# Patient Record
Sex: Male | Born: 1970 | Race: Black or African American | Hispanic: No | Marital: Single | Smoking: Never smoker
Health system: Southern US, Community
[De-identification: ages and names within clinical notes are randomized; demographics above are authoritative.]

---

## 2010-06-06 ENCOUNTER — Emergency Department (HOSPITAL_COMMUNITY)
Admission: EM | Admit: 2010-06-06 | Discharge: 2010-06-06 | Payer: Self-pay | Source: Home / Self Care | Admitting: Emergency Medicine

## 2011-01-16 LAB — POCT CARDIAC MARKERS
CKMB, poc: 1.3 ng/mL (ref 1.0–8.0)
Troponin i, poc: <0.05 ng/mL (ref 0.00–0.09)

## 2011-01-16 LAB — CBC
Hemoglobin: 16.3 g/dL (ref 13.0–17.0)
MCH: 29.2 pg (ref 26.0–34.0)
MCHC: 34.8 g/dL (ref 30.0–36.0)
MCV: 83.9 fL (ref 78.0–100.0)
RBC: 5.58 MIL/uL (ref 4.22–5.81)
RDW: 12.8 % (ref 11.5–15.5)
WBC: 6.1 10*3/uL (ref 4.0–10.5)

## 2011-01-16 LAB — DIFFERENTIAL
Basophils Absolute: 0 10*3/uL (ref 0.0–0.1)
Basophils Relative: 0 % (ref 0–1)
Eosinophils Absolute: 0 10*3/uL (ref 0.0–0.7)
Lymphocytes Relative: 60 % — ABNORMAL HIGH (ref 12–46)
Monocytes Relative: 8 % (ref 3–12)
Neutro Abs: 1.9 10*3/uL (ref 1.7–7.7)

## 2011-01-16 LAB — BASIC METABOLIC PANEL
Calcium: 9.7 mg/dL (ref 8.4–10.5)
GFR calc Af Amer: >60 mL/min (ref >60)
GFR calc non Af Amer: >60 mL/min (ref >60)
Potassium: 3.4 mEq/L — ABNORMAL LOW (ref 3.5–5.1)
Sodium: 137 mEq/L (ref 135–145)

## 2011-01-16 LAB — D-DIMER, QUANTITATIVE: D-Dimer, Quant: 0.23 ug/mL-FEU (ref 0.00–0.48)

## 2012-03-11 IMAGING — CT CT ANGIO CHEST
2 of 6 series · 19 of 36 positions shown · IV contrast (agent unspecified)
Comparison: [HOSPITAL] chest x-ray 06/06/2010.

CLINICAL DATA: Chest pain, pleuritic shortness of breath.
Evaluate for pulmonary embolus.

CT ANGIOGRAPHY CHEST WITH CONTRAST
TECHNIQUE: Multidetector CT imaging of the chest was performed
using the standard protocol during bolus administration of
intravenous contrast.  Multiplanar CT image reconstructions
including MIPs were obtained to evaluate the vascular anatomy.
Contrast:  Intravenous 100 ml 1mnipaque-HPP.

[Series 9: pulm embolism 1.0 b25f thins · axial · 0.70mm/px · z∈[-261,-25]mm · 18 of 264 slices shown]
[im 14/264  lung]
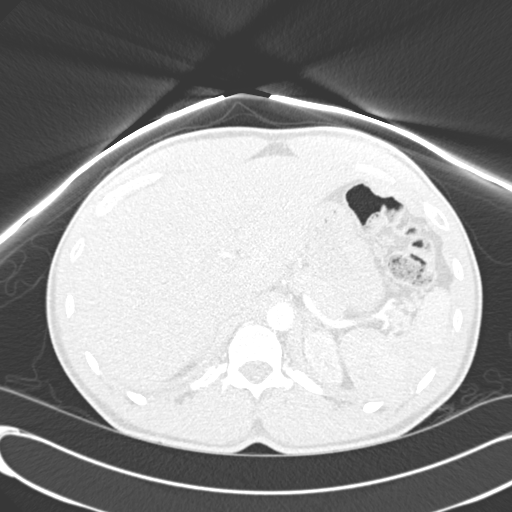
[im 27/264  mediastinal]
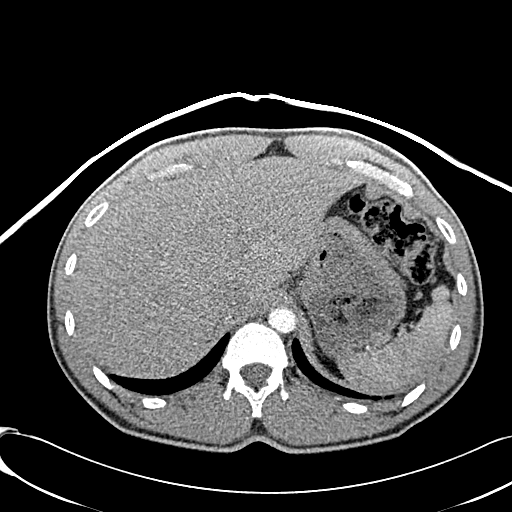
[im 40/264  lung]
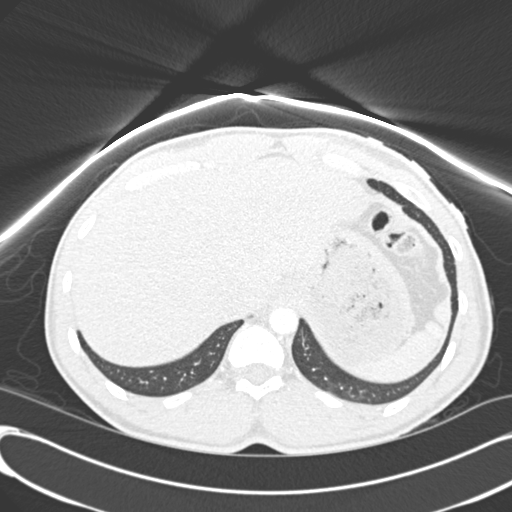
[im 53/264  mediastinal]
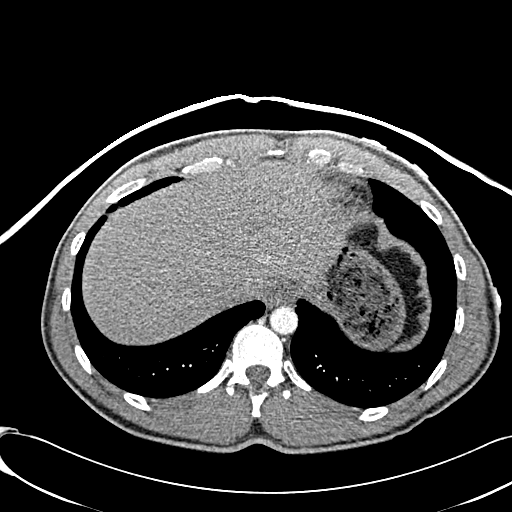
[im 66/264  lung]
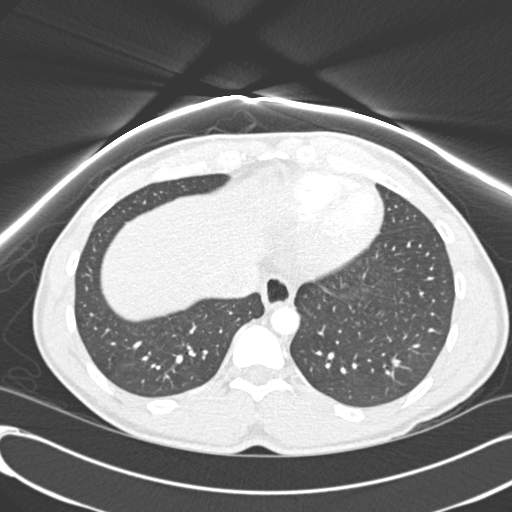
[im 79/264  mediastinal]
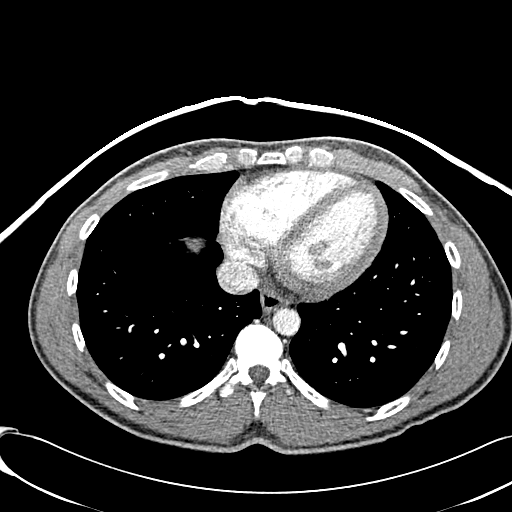
[im 93/264  lung]
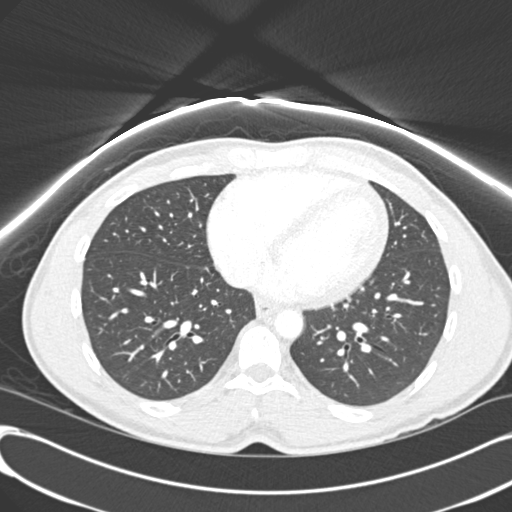
[im 106/264  mediastinal]
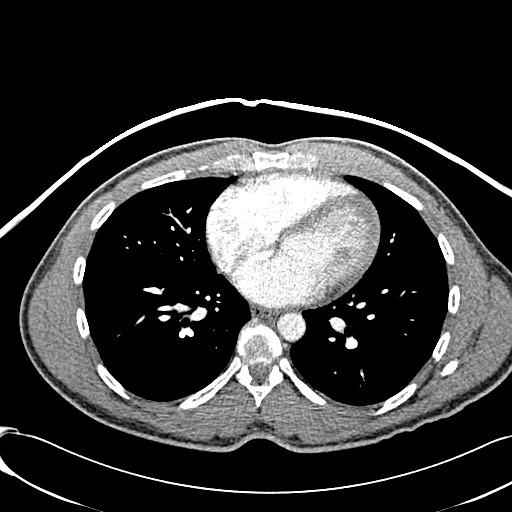
[im 119/264  lung]
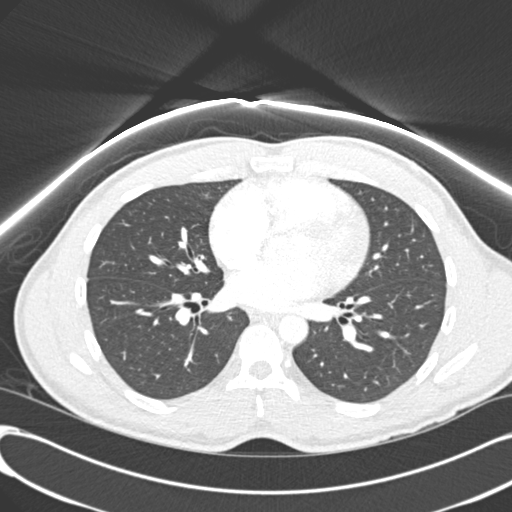
[im 145/264  mediastinal]
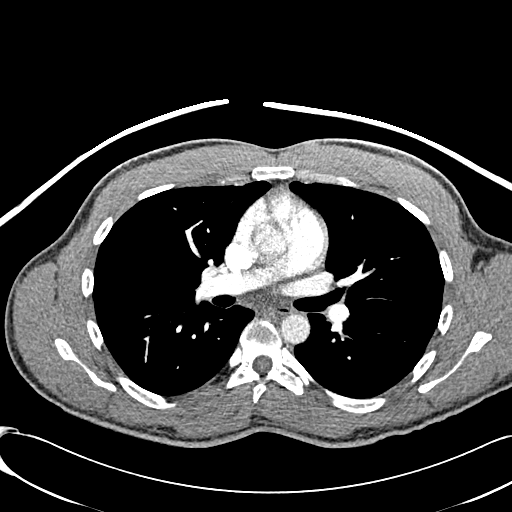
[im 158/264  lung]
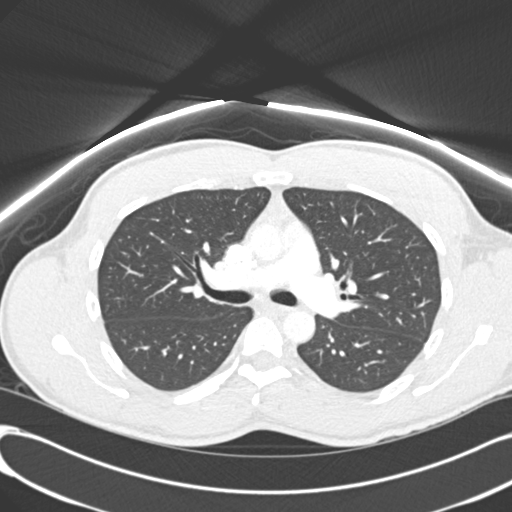
[im 171/264  mediastinal]
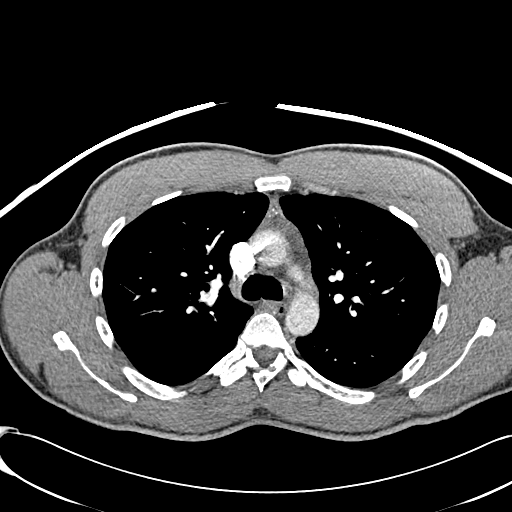
[im 185/264  lung]
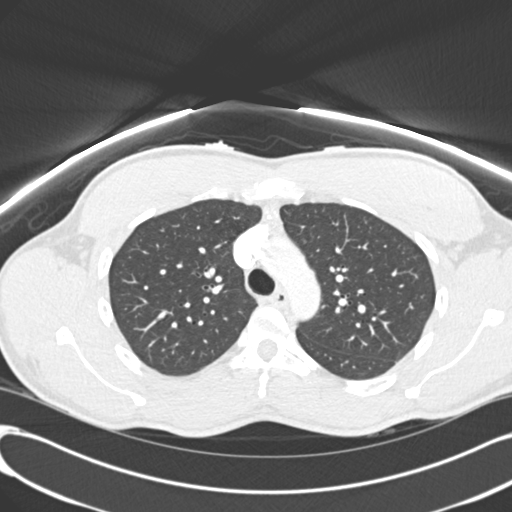
[im 198/264  mediastinal]
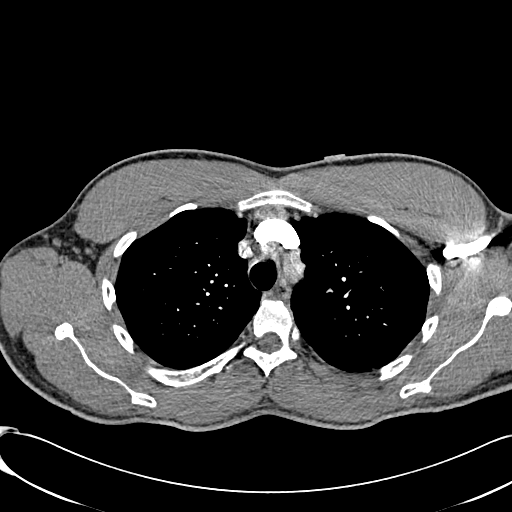
[im 211/264  lung]
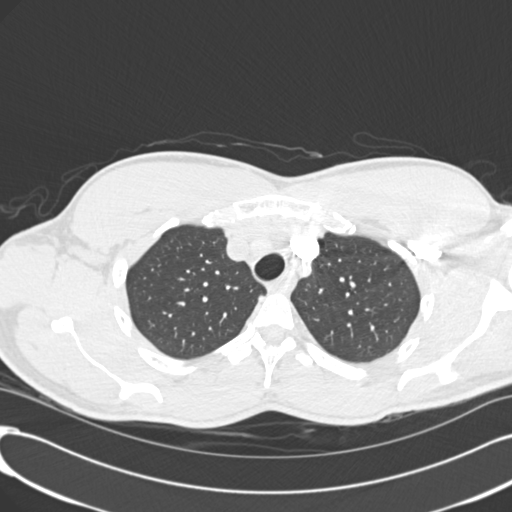
[im 224/264  mediastinal]
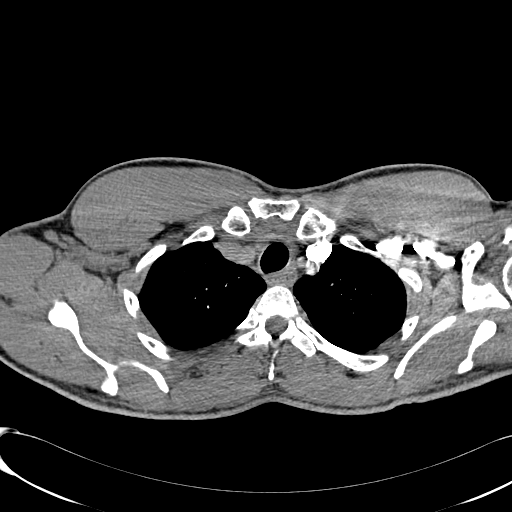
[im 237/264  lung]
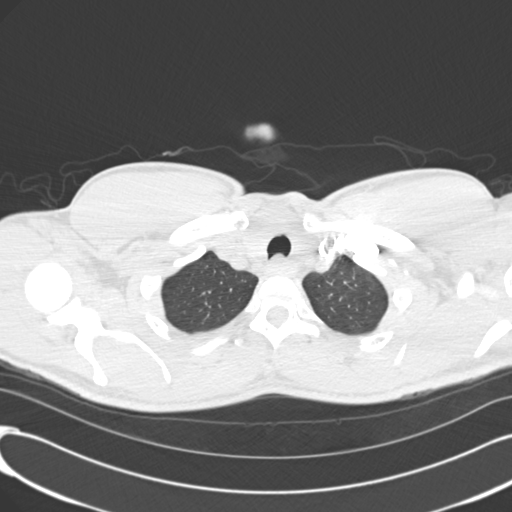
[im 250/264  mediastinal]
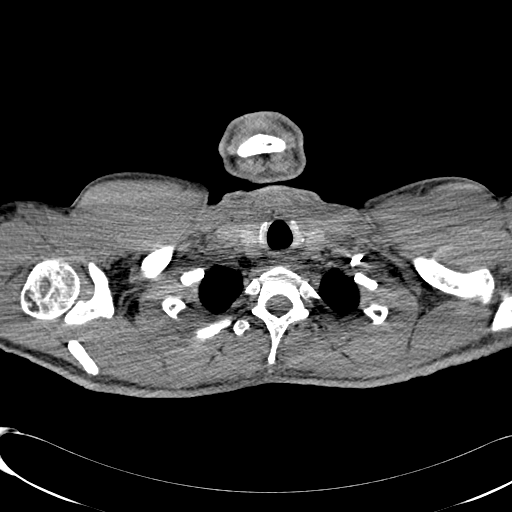

[Series 602: cor mpr · coronal · 0.70mm/px · 1 of 83 slices shown]
[im 42/83  mediastinal]
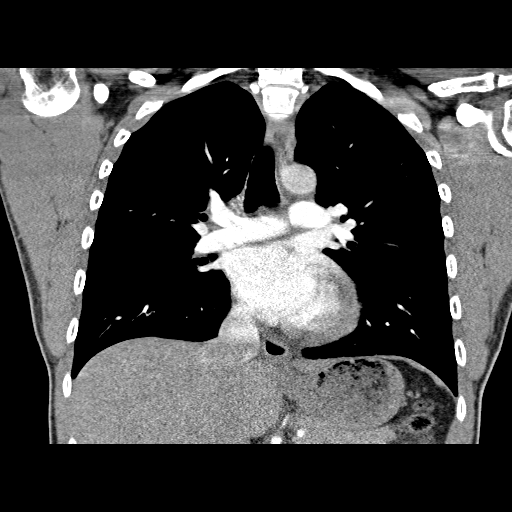

[19 of 36 positions shown; findings below may reference images not displayed]

FINDINGS: No evidence for acute pulmonary embolus is seen with
clear lungs.  Heart size normal.  No pneumothorax or pleural fluid
seen.  Upper abdominal organs appear normal.  At the anterior
superior mediastinum is serpiginous vessel fed by brachycephalic
vein consistent with likely benign small varix or arteriovenous
malformation.  No evidence for associated liter mass at thymus
seen.  No other mediastinal, hilar, axillary or supraclavicular
mass/adenopathy seen.  Osseous structures appear normal.

Review of the MIP images confirms the above findings.
IMPRESSION: 1.  No evidence for acute pulmonary embolus.
2.  Anterior superior mediastinal small varix or arteriovenous
malformation without complicating features.  Probable incidental
finding.
3.  Otherwise, negative.

## 2014-11-24 ENCOUNTER — Ambulatory Visit: Payer: Self-pay | Admitting: Podiatry

## 2014-12-05 ENCOUNTER — Encounter: Payer: Self-pay | Admitting: Podiatry

## 2014-12-05 ENCOUNTER — Ambulatory Visit (INDEPENDENT_AMBULATORY_CARE_PROVIDER_SITE_OTHER): Payer: BC Managed Care – PPO | Admitting: Podiatry

## 2014-12-05 DIAGNOSIS — L608 Other nail disorders: Secondary | ICD-10-CM

## 2014-12-05 DIAGNOSIS — B351 Tinea unguium: Secondary | ICD-10-CM

## 2014-12-05 NOTE — Progress Notes (Signed)
   Subjective:    Patient ID: Christopher Coleman, male    DOB: [birth year], 44 y.o.   MRN: [id number]  HPI  44 year old male presents the office today with complaints of thick, discolored toenails. He states he is previous even seen at the [place] where he is tried topical clotrimazole and various OTC treatments for nail fungus. He has not seen much improvement in symptoms with these medications. He denies any redness or drainage from the nail sites. He is inquiring about laser treatment. No other complaints at this time.   Review of Systems  Allergic/Immunologic: Positive for food allergies.  All other systems reviewed and are negative.      Objective:   Physical Exam AAO 3, NAD DP/PT pulses palpable, CRT less than 3 seconds Protective sensation intact with Simms Weinstein monofilament, vibratory sensation intact, Achilles tendon reflex intact. Nails are hypertrophic, dystrophic, discolored. There is no surrounding erythema or drainage from the nail sites. No open lesions or pre-ulcerative lesions. No areas of tenderness to bilateral lower extremities and no overlying edema, erythema, increase in warmth. MMT 5/5, ROM WNL No pain with calf compression, swelling, warmth, erythema.     Assessment & Plan:  44 year old male presents for likely onychomycosis inquiring about laser treatment. -Discussed various treatment options for onychomycosis with the patient. At this time the nails were biopsied and sent to [facility] last for evaluation before proceeding with treatment. I discussed with the patient laser treatment however we will await the results of the biopsy to see if laser is a viable option. -Follow-up after nail biopsy results or sooner if any problems are to arise. In the meantime, encouraged call the office with any questions, concerns, change in symptoms.

## 2014-12-05 NOTE — Patient Instructions (Signed)

## 2014-12-25 ENCOUNTER — Telehealth: Payer: Self-pay | Admitting: *Deleted

## 2014-12-25 NOTE — Telephone Encounter (Signed)
Pt request fungal culture results.  Informed pt the results take 4 - 6 week, and once received we would call him with instructions.

## 2014-12-27 ENCOUNTER — Encounter: Payer: Self-pay | Admitting: Podiatry

## 2015-01-04 ENCOUNTER — Telehealth: Payer: Self-pay | Admitting: *Deleted

## 2015-01-04 ENCOUNTER — Encounter: Payer: Self-pay | Admitting: Podiatry

## 2015-01-04 ENCOUNTER — Ambulatory Visit (INDEPENDENT_AMBULATORY_CARE_PROVIDER_SITE_OTHER): Payer: BC Managed Care – PPO | Admitting: Podiatry

## 2015-01-04 VITALS — BP 131/84 | HR 70 | Ht 63.0 in | Wt 155.0 lb

## 2015-01-04 DIAGNOSIS — B351 Tinea unguium: Secondary | ICD-10-CM | POA: Diagnosis not present

## 2015-01-04 DIAGNOSIS — C4372 Malignant melanoma of left lower limb, including hip: Secondary | ICD-10-CM

## 2015-01-04 NOTE — Telephone Encounter (Signed)
I called and informed patient we have him scheduled to see Dr. Allyson Sabal on 01/16/2015 at 10:20am.  It's off [address].  "I'm in a store right now, I can't write it down."  Okay, just look up [facility].  "I will thank you."

## 2015-01-04 NOTE — Patient Instructions (Signed)
Follow up with dermatology.

## 2015-01-07 ENCOUNTER — Encounter: Payer: Self-pay | Admitting: Podiatry

## 2015-01-07 NOTE — Progress Notes (Signed)
Patient ID: Christopher Coleman, male   DOB: January 16, 1971, 44 y.o.   MRN: [id number]  Subjective: 44 year old male presents the opposite discussed the nail culture results. He denies any acute changes since last appointment and no other complaints at this time. Denies any systemic complaints as fevers, chills, nausea, vomiting.  Objective: AAO 3, NAD DP/PT pulses palpable, CRT less than 3 seconds Protective sensation intact with Simms Weinstein monofilament, Achilles tendon reflex intact. Nails are hypertrophic, dystrophic, discolored 10. There is no swelling erythema or drainage Nail sites. There is no tenderness to palpation of the nails. There is darkened discoloration with linear hyperpigmented lines with in both the left and fifth digit and the right fifth digit. There is no extension of the hyperpigmented lesion within the cuticle or skin. The pigment color changes appear to be uniform in color within the nail. No open lesions or pre-ulcerative lesions identified bilaterally. No overlying edema, erythema, increase in warmth to bilateral lower extremity is. No pain with calf compression, swelling, warmth, erythema.  Assessment: 44 year old male with onychomycosis, possible melanotic lesion  Plan: -Nail biopsy results were reviewed with the patient. It does reveal onychomycosis however it also did reveal possible melanotic lesion within the toenail. Due to these findings discuss further biopsy the nails or dermatology evaluation. This time we'll proceed with dermatology evaluation. The patient is also a patient the [facility] and he was given copies of his nail biopsy results. He has an appointment with podiatry at the [facility] on 01/07/2015. In regards to the nail fungus he was inquiring about laser therapy. Discussed and this is an option however for proceed with laser will pursue dermatology evaluation. -Follow-up of the dermatology or sooner should any problems arise. In the meantime  encouraged to call the office any questions, concerns, change in symptoms.

## 2020-01-06 ENCOUNTER — Ambulatory Visit: Payer: Self-pay | Attending: Internal Medicine

## 2020-01-06 DIAGNOSIS — Z23 Encounter for immunization: Secondary | ICD-10-CM | POA: Insufficient documentation

## 2020-01-06 NOTE — Progress Notes (Signed)
   Covid-19 Vaccination Clinic  Name:  Christopher Coleman    MRN: [id number] DOB: 06-01-1971  01/06/2020  [name] was observed post Covid-19 immunization for 15 minutes without incident. He was provided with Vaccine Information Sheet and instruction to access the V-Safe system.   [name] was instructed to call 911 with any severe reactions post vaccine: Marland Kitchen Difficulty breathing  . Swelling of face and throat  . A fast heartbeat  . A bad rash all over body  . Dizziness and weakness   Immunizations Administered    Name Date Dose VIS Date Route   Pfizer COVID-19 Vaccine 01/06/2020  9:18 AM 0.3 mL 10/13/2019 Intramuscular   Manufacturer: [place]   Lot: [id number]   [place]: [id number]

## 2020-01-27 ENCOUNTER — Ambulatory Visit: Payer: Self-pay | Attending: Internal Medicine

## 2020-01-27 DIAGNOSIS — Z23 Encounter for immunization: Secondary | ICD-10-CM

## 2020-01-27 NOTE — Progress Notes (Signed)
   Covid-19 Vaccination Clinic  Name:  Tari Jointer    MRN: [id number] DOB: 1970/12/28  01/27/2020  Mr. Sprunk was observed post Covid-19 immunization for 15 minutes without incident. He was provided with Vaccine Information Sheet and instruction to access the V-Safe system.   Mr. Adinolfi was instructed to call 911 with any severe reactions post vaccine: Marland Kitchen Difficulty breathing  . Swelling of face and throat  . A fast heartbeat  . A bad rash all over body  . Dizziness and weakness   Immunizations Administered    Name Date Dose VIS Date Route   Pfizer COVID-19 Vaccine 01/27/2020  9:08 AM 0.3 mL 10/13/2019 Intramuscular   Manufacturer: [place]   Lot: [id number]   [place]: [id number]

## 2020-06-10 ENCOUNTER — Other Ambulatory Visit: Payer: Self-pay

## 2020-06-10 ENCOUNTER — Ambulatory Visit (INDEPENDENT_AMBULATORY_CARE_PROVIDER_SITE_OTHER): Payer: BC Managed Care – PPO | Admitting: Podiatry

## 2020-06-10 ENCOUNTER — Ambulatory Visit (INDEPENDENT_AMBULATORY_CARE_PROVIDER_SITE_OTHER): Payer: BC Managed Care – PPO

## 2020-06-10 DIAGNOSIS — M2142 Flat foot [pes planus] (acquired), left foot: Secondary | ICD-10-CM

## 2020-06-10 DIAGNOSIS — M722 Plantar fascial fibromatosis: Secondary | ICD-10-CM | POA: Diagnosis not present

## 2020-06-10 DIAGNOSIS — M2141 Flat foot [pes planus] (acquired), right foot: Secondary | ICD-10-CM | POA: Diagnosis not present

## 2020-06-10 DIAGNOSIS — M79671 Pain in right foot: Secondary | ICD-10-CM

## 2020-06-10 NOTE — Patient Instructions (Signed)

## 2020-06-11 ENCOUNTER — Other Ambulatory Visit: Payer: Self-pay | Admitting: Podiatry

## 2020-06-11 DIAGNOSIS — M2142 Flat foot [pes planus] (acquired), left foot: Secondary | ICD-10-CM

## 2020-06-11 NOTE — Progress Notes (Signed)
Subjective:   Patient ID: Christopher Coleman, male   DOB: 49 y.o.   MRN: [id number]   HPI 49 year old male presents the office today for concerns of bilateral foot pain, plantar fasciitis.  He states that he previously had plantar fasciitis to the heels but he states the pain is moved to the arch of the foot currently also has flatfeet.  His orthotics are old but he is recently been measured for a new pair of orthotics that he picks at later this week.  He denies any recent injury or trauma to his feet no swelling or redness.  No numbness or tingling.  No other concerns today.   Review of Systems  All other systems reviewed and are negative.  No past medical history on file.  No past surgical history on file.   Current Outpatient Medications:  .  clotrimazole (LOTRIMIN) 1 % external solution, , Disp: , Rfl:  .  fexofenadine (ALLEGRA) 180 MG tablet, Take 180 mg by mouth., Disp: , Rfl:  .  ibuprofen (ADVIL,MOTRIN) 800 MG tablet, , Disp: , Rfl:  .  Multiple Vitamin (MULTIVITAMIN) tablet, Take 1 tablet by mouth., Disp: , Rfl:  .  valACYclovir (VALTREX) 500 MG tablet, , Disp: , Rfl:   Allergies  Allergen Reactions  . Shellfish Allergy Nausea And Vomiting    Saw allergist and skin test positive-saw as child        Objective:  Physical Exam  General: AAO x3, NAD  Dermatological: Skin is warm, dry and supple bilateral. There are no open sores, no preulcerative lesions, no rash or signs of infection present.  Vascular: Dorsalis Pedis artery and Posterior Tibial artery pedal pulses are 2/4 bilateral with immedate capillary fill time. There is no pain with calf compression, swelling, warmth, erythema.   Neruologic: Grossly intact via light touch bilateral. Negative tinel sign.   Musculoskeletal: Decreased medial arch upon weightbearing.  Majority of tenderness is on the medial pain the plantar fashion the arch of the foot.  No pain along insertion.  There is no area pinpoint tenderness.   Ankle, subtalar range of motion intact but any restrictions.  Muscular strength 5/5 in all groups tested bilateral.  Gait: Unassisted, Nonantalgic.       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  There is no evidence of acute fracture.  Flatfoot is evident. -I do think he benefit from the orthotics he is already been measured and he is picking these up on Friday.  We discussed stretching, icing daily with close modifications.  He has been trying to take anti-inflammatories weakness as needed we also discussed Voltaren gel which she has at home.  Discussed with him that if pain continues after the orthotics and doing this to let me know otherwise I will see him back as needed.  Patient originally without any sure while he was at the appointment today or whether referral was placed by the [place].  Return if symptoms worsen or fail to improve.
